# Patient Record
Sex: Male | Born: 1990 | Race: Black or African American | Hispanic: No | Marital: Married | Smoking: Never smoker
Health system: Southern US, Community
[De-identification: ages and names within clinical notes are randomized; demographics above are authoritative.]

---

## 2019-07-16 DIAGNOSIS — Z23 Encounter for immunization: Secondary | ICD-10-CM | POA: Diagnosis not present

## 2019-08-13 DIAGNOSIS — Z23 Encounter for immunization: Secondary | ICD-10-CM | POA: Diagnosis not present

## 2020-07-26 ENCOUNTER — Emergency Department (HOSPITAL_COMMUNITY): Payer: Medicaid Other

## 2020-07-26 ENCOUNTER — Emergency Department (HOSPITAL_COMMUNITY)
Admission: EM | Admit: 2020-07-26 | Discharge: 2020-07-26 | Disposition: A | Discharge status: Home / Self Care | Payer: Medicaid Other | Attending: Emergency Medicine | Admitting: Emergency Medicine

## 2020-07-26 ENCOUNTER — Other Ambulatory Visit: Payer: Self-pay

## 2020-07-26 DIAGNOSIS — S60212A Contusion of left wrist, initial encounter: Secondary | ICD-10-CM

## 2020-07-26 DIAGNOSIS — S0081XA Abrasion of other part of head, initial encounter: Secondary | ICD-10-CM | POA: Diagnosis not present

## 2020-07-26 DIAGNOSIS — M25532 Pain in left wrist: Secondary | ICD-10-CM | POA: Diagnosis not present

## 2020-07-26 DIAGNOSIS — S0990XA Unspecified injury of head, initial encounter: Secondary | ICD-10-CM

## 2020-07-26 DIAGNOSIS — G4489 Other headache syndrome: Secondary | ICD-10-CM | POA: Diagnosis not present

## 2020-07-26 DIAGNOSIS — I10 Essential (primary) hypertension: Secondary | ICD-10-CM | POA: Diagnosis not present

## 2020-07-26 DIAGNOSIS — Y92410 Unspecified street and highway as the place of occurrence of the external cause: Secondary | ICD-10-CM | POA: Insufficient documentation

## 2020-07-26 MED ORDER — METHOCARBAMOL 500 MG PO TABS: 500.0000 mg | ORAL_TABLET | Freq: Two times a day (BID) | ORAL | 0 refills | Status: DC

## 2020-07-26 MED ORDER — IBUPROFEN 400 MG PO TABS
600.0000 mg | ORAL_TABLET | Freq: Once | ORAL | Status: AC
  Administered 2020-07-26: 600 mg via ORAL
  Filled 2020-07-26: qty 1

## 2020-07-26 MED ORDER — METHOCARBAMOL 500 MG PO TABS
500.0000 mg | ORAL_TABLET | Freq: Once | ORAL | Status: AC
  Administered 2020-07-26: 500 mg via ORAL
  Filled 2020-07-26: qty 1

## 2020-07-26 MED ORDER — ACETAMINOPHEN 325 MG PO TABS
650.0000 mg | ORAL_TABLET | Freq: Once | ORAL | Status: AC
  Administered 2020-07-26: 650 mg via ORAL
  Filled 2020-07-26: qty 2
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
## 2020-07-26 NOTE — ED Notes (Signed)
Ortho tech paged for wrist brace placement.

## 2020-07-26 NOTE — ED Notes (Signed)
Patient transported to X-ray 

## 2020-07-26 NOTE — Progress Notes (Signed)
Orthopedic Tech Progress Note Patient Details:  Jesse Vang 1990/09/04 [id number]  Ortho Devices Type of Ortho Device: Velcro wrist splint Ortho Device/Splint Location: lue Ortho Device/Splint Interventions: Ordered,Application,Adjustment   Post Interventions Patient Tolerated: Well Instructions Provided: Care of device,Adjustment of device   Trinna Post 07/26/2020, 9:37 PM
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
## 2020-07-26 NOTE — ED Notes (Signed)
Patient transported to CT 

## 2020-07-27 ENCOUNTER — Telehealth: Payer: Self-pay | Admitting: *Deleted

## 2020-07-27 NOTE — Telephone Encounter (Signed)
Transition Care Management Follow-up Telephone Call  Date of discharge and from where: 07/26/2020 - [facility]  How have you been since you were released from the hospital? "I feel fine"  Any questions or concerns? No  Items Reviewed:  Did the pt receive and understand the discharge instructions provided? Yes   Medications obtained and verified? Yes   Other? No   Any new allergies since your discharge? No   Dietary orders reviewed? No  Do you have support at home? Yes     Functional Questionnaire: (I = Independent and D = Dependent) ADLs: I  Bathing/Dressing- I  Meal Prep- I  Eating- I  Maintaining continence- I  Transferring/Ambulation- I  Managing Meds- I  Follow up appointments reviewed:   PCP Hospital f/u appt confirmed? No    Specialist Hospital f/u appt confirmed? No    Are transportation arrangements needed? No   If their condition worsens, is the pt aware to call PCP or go to the Emergency Dept.? Yes  Was the patient provided with contact information for the PCP's office or ED? Yes  Was to pt encouraged to call back with questions or concerns? Yes

## 2020-08-05 DIAGNOSIS — M79644 Pain in right finger(s): Secondary | ICD-10-CM | POA: Diagnosis not present

## 2020-08-05 DIAGNOSIS — M25532 Pain in left wrist: Secondary | ICD-10-CM | POA: Diagnosis not present

## 2020-08-18 DIAGNOSIS — M654 Radial styloid tenosynovitis [de Quervain]: Secondary | ICD-10-CM | POA: Diagnosis not present

## 2020-08-18 DIAGNOSIS — M25532 Pain in left wrist: Secondary | ICD-10-CM | POA: Diagnosis not present

## 2020-08-19 DIAGNOSIS — H5213 Myopia, bilateral: Secondary | ICD-10-CM | POA: Diagnosis not present

## 2020-09-24 DIAGNOSIS — M654 Radial styloid tenosynovitis [de Quervain]: Secondary | ICD-10-CM | POA: Insufficient documentation

## 2020-09-25 DIAGNOSIS — M654 Radial styloid tenosynovitis [de Quervain]: Secondary | ICD-10-CM | POA: Diagnosis not present

## 2021-11-16 ENCOUNTER — Ambulatory Visit: Payer: Medicaid Other | Admitting: Critical Care Medicine

## 2022-04-12 ENCOUNTER — Encounter: Payer: Self-pay | Admitting: Family Medicine

## 2022-04-12 ENCOUNTER — Ambulatory Visit (INDEPENDENT_AMBULATORY_CARE_PROVIDER_SITE_OTHER): Payer: Medicaid Other | Admitting: Family Medicine

## 2022-04-12 VITALS — BP 128/88 | HR 68 | Ht 73.0 in | Wt 181.1 lb

## 2022-04-12 DIAGNOSIS — R079 Chest pain, unspecified: Secondary | ICD-10-CM

## 2022-04-12 DIAGNOSIS — R7301 Impaired fasting glucose: Secondary | ICD-10-CM | POA: Diagnosis not present

## 2022-04-12 DIAGNOSIS — E039 Hypothyroidism, unspecified: Secondary | ICD-10-CM | POA: Diagnosis not present

## 2022-04-12 DIAGNOSIS — E559 Vitamin D deficiency, unspecified: Secondary | ICD-10-CM

## 2022-04-12 DIAGNOSIS — Z1159 Encounter for screening for other viral diseases: Secondary | ICD-10-CM

## 2022-04-12 DIAGNOSIS — Z114 Encounter for screening for human immunodeficiency virus [HIV]: Secondary | ICD-10-CM | POA: Diagnosis not present

## 2022-04-12 DIAGNOSIS — I2089 Other forms of angina pectoris: Secondary | ICD-10-CM | POA: Diagnosis not present

## 2022-04-12 DIAGNOSIS — E7849 Other hyperlipidemia: Secondary | ICD-10-CM

## 2022-04-12 NOTE — Progress Notes (Signed)
New Patient Office Visit  Subjective:  Patient ID: Jesse Vang, male    DOB: [birth year]  Age: 32 y.o. MRN: [id number]  CC:  Chief Complaint  Patient presents with   New Patient (Initial Visit)    Establishing care needs pcp. States he would like a general check up. Reports mild chest pain at times last time it happened was 03/22/2022.    HPI Jesse Vang is a 32 y.o. male with past medical history of stable angina presents for establishing care. For the details of today's visit, please refer to the assessment and plan.     History reviewed. No pertinent past medical history.  History reviewed. No pertinent surgical history.  History reviewed. No pertinent family history.  Social History   Socioeconomic History   Marital status: Married    Spouse name: Not on file   Number of children: Not on file   Years of education: Not on file   Highest education level: Not on file  Occupational History   Not on file  Tobacco Use   Smoking status: Never    Passive exposure: Never   Smokeless tobacco: Never  Substance and Sexual Activity   Alcohol use: Not Currently   Drug use: Not Currently   Sexual activity: Yes  Other Topics Concern   Not on file  Social History Narrative   Not on file   Social Determinants of Health   Financial Resource Strain: Not on file  Food Insecurity: Not on file  Transportation Needs: Not on file  Physical Activity: Not on file  Stress: Not on file  Social Connections: Not on file  Intimate Partner Violence: Not on file    ROS Review of Systems  Constitutional:  Negative for fatigue and fever.  Eyes:  Negative for visual disturbance.  Respiratory:  Negative for chest tightness and shortness of breath.   Cardiovascular:  Negative for chest pain and palpitations.  Neurological:  Negative for dizziness and headaches.    Objective:   Today's Vitals: BP 128/88 (BP Location: Left Arm)   Pulse 68   Ht _0  (1.854 m)   Wt 181 lb  1.9 oz (82.2 kg)   SpO2 96%   BMI 23.90 kg/m   Physical Exam HENT:     Head: Normocephalic.     Right Ear: External ear normal.     Left Ear: External ear normal.     Nose: No congestion or rhinorrhea.     Mouth/Throat:     Mouth: Mucous membranes are moist.  Cardiovascular:     Rate and Rhythm: Regular rhythm.     Heart sounds: No murmur heard. Pulmonary:     Effort: No respiratory distress.     Breath sounds: Normal breath sounds.  Neurological:     Mental Status: He is alert.      Assessment & Plan:   Stable angina pectoris Assessment & Plan: Onset of symptoms for a year He reports occasional chest pain at least twice a month Pain is rated 6-7 out of 10 Chest pain occurs at rest for less than 5 minutes Pain is described as sharp and nonradiating Pain occurs with activities None reported today No history or family history of cardiovascular disease  Patient is asymptomatic in the clinic EKG in the clinic shows sinus rhythm with first-degree AV block No evidence of ST elevation on EKG Given that patient complains of sporadic exertional chest pain, will refer to cardiology for further evaluation Will assess lipid panel today  Orders: -     Ambulatory referral to Cardiology  Chest pain at rest -     EKG 12-Lead  IFG (impaired fasting glucose) -     Hemoglobin A1c  Vitamin D deficiency -     VITAMIN D 25 Hydroxy (Vit-D Deficiency, Fractures)  Need for hepatitis C screening test -     Hepatitis C antibody  Encounter for screening for HIV -     HIV Antibody (routine testing w rflx)  Hypothyroidism, unspecified type -     TSH + free T4  Other hyperlipidemia -     Lipid panel -     CMP14+EGFR -     CBC with Differential/Platelet     Follow-up: Return in about 4 months (around 08/11/2022).   Alvira Monday, FNP

## 2022-04-12 NOTE — Patient Instructions (Addendum)
I appreciate the opportunity to provide care to you today!    Follow up:  4 months  Labs: please stop by the lab today to get your blood drawn (CBC, CMP, TSH, Lipid profile, HgA1c, Vit D)  Screening: HIV and Hep C   Please continue to a heart-healthy diet and increase your physical activities.   Physical activity helps: Lower your blood glucose, improve your heart health, lower your blood pressure and cholesterol, burn calories to help manage her weight, gave you energy, lower stress, and improve his sleep.  The American diabetes Association (ADA) recommends being active for 2-1/2 hours (150 minutes) or more week.  Exercise for 30 minutes, 5 days a week (150 minutes total)    Referral: cardiology   It was a pleasure to see you and I look forward to continuing to work together on your health and well-being. Please do not hesitate to call the office if you need care or have questions about your care.   Have a wonderful day and week. With Gratitude, Alvira Monday MSN, FNP-BC

## 2022-04-12 NOTE — Assessment & Plan Note (Signed)
Onset of symptoms for a year He reports occasional chest pain at least twice a month Pain is rated 6-7 out of 10 Chest pain occurs at rest for less than 5 minutes Pain is described as sharp and nonradiating Pain occurs with activities None reported today No history or family history of cardiovascular disease  Patient is asymptomatic in the clinic EKG in the clinic shows sinus rhythm with first-degree AV block No evidence of ST elevation on EKG Given that patient complains of sporadic exertional chest pain, will refer to cardiology for further evaluation Will assess lipid panel today

## 2022-04-13 LAB — CMP14+EGFR
ALT: 39 IU/L (ref 0–44)
AST: 31 IU/L (ref 0–40)
Albumin/Globulin Ratio: 1.4 (ref 1.2–2.2)
Albumin: 4.5 g/dL (ref 4.3–5.2)
Alkaline Phosphatase: 67 IU/L (ref 44–121)
BUN/Creatinine Ratio: 13 (ref 9–20)
BUN: 13 mg/dL (ref 6–20)
Bilirubin Total: 0.5 mg/dL (ref 0.0–1.2)
CO2: 24 mmol/L (ref 20–29)
Calcium: 9.8 mg/dL (ref 8.7–10.2)
Chloride: 102 mmol/L (ref 96–106)
Creatinine, Ser: 1.01 mg/dL (ref 0.76–1.27)
Globulin, Total: 3.3 g/dL (ref 1.5–4.5)
Glucose: 92 mg/dL (ref 70–99)
Potassium: 4.3 mmol/L (ref 3.5–5.2)
Sodium: 142 mmol/L (ref 134–144)
Total Protein: 7.8 g/dL (ref 6.0–8.5)
eGFR: 103 mL/min/{1.73_m2} (ref >59)

## 2022-04-13 LAB — CBC WITH DIFFERENTIAL/PLATELET
Basophils Absolute: 0 10*3/uL (ref 0.0–0.2)
Basos: 1 %
EOS (ABSOLUTE): 0.2 10*3/uL (ref 0.0–0.4)
Eos: 5 %
Hematocrit: 44 % (ref 37.5–51.0)
Hemoglobin: 14.5 g/dL (ref 13.0–17.7)
Immature Grans (Abs): 0 10*3/uL (ref 0.0–0.1)
Immature Granulocytes: 0 %
Lymphocytes Absolute: 1.8 10*3/uL (ref 0.7–3.1)
Lymphs: 38 %
MCH: 28.3 pg (ref 26.6–33.0)
MCHC: 33 g/dL (ref 31.5–35.7)
MCV: 86 fL (ref 79–97)
Monocytes Absolute: 0.3 10*3/uL (ref 0.1–0.9)
Monocytes: 7 %
Neutrophils Absolute: 2.3 10*3/uL (ref 1.4–7.0)
Neutrophils: 49 %
Platelets: 309 10*3/uL (ref 150–450)
RBC: 5.12 x10E6/uL (ref 4.14–5.80)
RDW: 11.4 % — ABNORMAL LOW (ref 11.6–15.4)
WBC: 4.7 10*3/uL (ref 3.4–10.8)

## 2022-04-13 LAB — HEPATITIS C ANTIBODY: Hep C Virus Ab: NONREACTIVE

## 2022-04-13 LAB — LIPID PANEL
Chol/HDL Ratio: 3.1 ratio (ref 0.0–5.0)
Cholesterol, Total: 178 mg/dL (ref 100–199)
HDL: 57 mg/dL (ref >39)
LDL Chol Calc (NIH): 101 mg/dL — ABNORMAL HIGH (ref 0–99)
Triglycerides: 111 mg/dL (ref 0–149)
VLDL Cholesterol Cal: 20 mg/dL (ref 5–40)

## 2022-04-13 LAB — HEMOGLOBIN A1C
Est. average glucose Bld gHb Est-mCnc: 100 mg/dL
Hgb A1c MFr Bld: 5.1 % (ref 4.8–5.6)

## 2022-04-13 LAB — VITAMIN D 25 HYDROXY (VIT D DEFICIENCY, FRACTURES): Vit D, 25-Hydroxy: 19 ng/mL — ABNORMAL LOW (ref 30.0–100.0)

## 2022-04-13 LAB — TSH+FREE T4
Free T4: 1.29 ng/dL (ref 0.82–1.77)
TSH: 2.33 u[IU]/mL (ref 0.450–4.500)

## 2022-04-13 LAB — HIV ANTIBODY (ROUTINE TESTING W REFLEX): HIV Screen 4th Generation wRfx: NONREACTIVE

## 2022-04-18 ENCOUNTER — Other Ambulatory Visit: Payer: Self-pay | Admitting: Family Medicine

## 2022-04-18 DIAGNOSIS — E559 Vitamin D deficiency, unspecified: Secondary | ICD-10-CM

## 2022-04-18 MED ORDER — VITAMIN D (ERGOCALCIFEROL) 1.25 MG (50000 UNIT) PO CAPS: 50000.0000 [IU] | ORAL_CAPSULE | ORAL | 1 refills | Status: AC

## 2022-04-18 NOTE — Progress Notes (Signed)
A weekly vitamin D supplement prescription has been sent to your pharmacy because your vitamin D is low.I recommend implementing lifestyle changes by decreasing your intake of saturated and trans fat, with a low-carb diet, and increasing physical activities.

## 2022-05-23 ENCOUNTER — Ambulatory Visit: Payer: Medicaid Other | Attending: Internal Medicine | Admitting: Internal Medicine

## 2022-05-24 ENCOUNTER — Encounter: Payer: Self-pay | Admitting: Internal Medicine

## 2022-06-09 ENCOUNTER — Ambulatory Visit: Payer: Medicaid Other | Attending: Internal Medicine | Admitting: Internal Medicine

## 2022-06-09 ENCOUNTER — Encounter: Payer: Self-pay | Admitting: Internal Medicine

## 2022-06-09 VITALS — BP 120/72 | HR 80 | Ht 73.0 in | Wt 184.4 lb

## 2022-06-09 DIAGNOSIS — R0789 Other chest pain: Secondary | ICD-10-CM | POA: Diagnosis not present

## 2022-06-09 DIAGNOSIS — I440 Atrioventricular block, first degree: Secondary | ICD-10-CM | POA: Diagnosis not present

## 2022-06-09 NOTE — Progress Notes (Signed)
Cardiology Office Note  Date: 06/09/2022   ID: Jesse Vang, DOB 05-Oct-1990, MRN [id number]  PCP:  Alvira Monday, [place]  Cardiologist:  None Electrophysiologist:  None   Reason for Office Visit: Evaluation chest pain at the request of [place], FNP   History of Present Illness: Jesse Vang is a 32 y.o. male with no PMH was referred to cardiology clinic for evaluation of chest pain.  Patient has substernal chest tightness lasting for 10 to 15 minutes x started few years ago, occurs randomly few times per month, no relation with rest or exercise and partially relieved when he stretches his arms/body. Patient is physically very active at baseline, runs and plays basketball few times every week and denies any symptoms of chest tightness during exertional activity. No other symptoms of exertional dizziness/ lightheadedness, syncope, DOE.   Current Outpatient Medications  Medication Sig Dispense Refill   Vitamin D, Ergocalciferol, (DRISDOL) 1.25 MG (50000 UNIT) CAPS capsule Take 1 capsule (50,000 Units total) by mouth every 7 (seven) days. (Patient not taking: Reported on 06/09/2022) 10 capsule 1   No current facility-administered medications for this visit.   Allergies:  Penicillin g   Social History: The patient  reports that he has never smoked. He has never been exposed to tobacco smoke. He has never used smokeless tobacco. He reports that he does not currently use alcohol. He reports that he does not currently use drugs.   Family History: The patient's family history includes Hypertension in his mother.   ROS:  Please see the history of present illness. Otherwise, complete review of systems is positive for none.  All other systems are reviewed and negative.   Physical Exam: VS:  BP 120/72   Pulse 80   Ht '6\' 1"'$  (1.854 m)   Wt 184 lb 6.4 oz (83.6 kg)   SpO2 98%   BMI 24.33 kg/m , BMI Body mass index is 24.33 kg/m.  Wt Readings from Last 3 Encounters:  06/09/22 184 lb  6.4 oz (83.6 kg)  04/12/22 181 lb 1.9 oz (82.2 kg)  07/26/20 163 lb (73.9 kg)    General: Patient appears comfortable at rest. HEENT: Conjunctiva and lids normal, oropharynx clear with moist mucosa. Neck: Supple, no elevated JVP or carotid bruits, no thyromegaly. Lungs: Clear to auscultation, nonlabored breathing at rest. Cardiac: Regular rate and rhythm, no S3 or significant systolic murmur, no pericardial rub. Abdomen: Soft, nontender, no hepatomegaly, bowel sounds present, no guarding or rebound. Extremities: No pitting edema, distal pulses 2+. Skin: Warm and dry. Musculoskeletal: No kyphosis. Neuropsychiatric: Alert and oriented x3, affect grossly appropriate.  ECG:  An ECG dated 04/2022 was personally reviewed today and demonstrated:  Normal sinus rhythm, no ST changes  Recent Labwork: 04/12/2022: ALT 39; AST 31; BUN 13; Creatinine, Ser 1.01; Hemoglobin 14.5; Platelets 309; Potassium 4.3; Sodium 142; TSH 2.330     Component Value Date/Time   CHOL 178 04/12/2022 0915   TRIG 111 04/12/2022 0915   HDL 57 04/12/2022 0915   CHOLHDL 3.1 04/12/2022 0915   LDLCALC 101 (H) 04/12/2022 0915    Other Studies Reviewed Today:   Assessment and Plan: Patient is a 32 year old M with no PMH was referred to cardiology clinic for evaluation of chest pain.  # Noncardiac chest pain -Patient denies any chest tightness during running and playing basketball. He mainly has chest tightness at rest and partially relieved by stretching his body. No further cardiac testing is indicated at this time. Follow-up with PCP for noncardiac causes  of chest tightness.  # First-degree AV block -PR interval is 301 ms on EKG from 04/2022.  Benign, reassurance. No symptoms of dizziness, lightheadedness, syncope.  I have spent a total of 30 minutes with patient reviewing chart, EKGs, labs and examining patient as well as establishing an assessment and plan that was discussed with the patient.  > 50% of time was spent  in direct patient care.     Medication Adjustments/Labs and Tests Ordered: Current medicines are reviewed at length with the patient today.  Concerns regarding medicines are outlined above.   Tests Ordered: No orders of the defined types were placed in this encounter.   Medication Changes: No orders of the defined types were placed in this encounter.   Disposition:  Follow up prn  Signed, Abad Manard Fidel Levy, MD, 06/09/2022 8:46 AM    [facility] [address] [place], [place] [address]

## 2022-06-09 NOTE — Patient Instructions (Signed)
Medication Instructions:  Your physician recommends that you continue on your current medications as directed. Please refer to the Current Medication list given to you today.  *If you need a refill on your cardiac medications before your next appointment, please call your pharmacy*   Lab Work: None If you have labs (blood work) drawn today and your tests are completely normal, you will receive your results only by: [place] (if you have MyChart) OR A paper copy in the mail If you have any lab test that is abnormal or we need to change your treatment, we will call you to review the results.   Testing/Procedures: None   Follow-Up: At [facility], you and your health needs are our priority.  As part of our continuing mission to provide you with exceptional heart care, we have created designated Provider Care Teams.  These Care Teams include your primary Cardiologist (physician) and Advanced Practice Providers (APPs -  Physician Assistants and Nurse Practitioners) who all work together to provide you with the care you need, when you need it.  We recommend signing up for the patient portal called "MyChart".  Sign up information is provided on this After Visit Summary.  MyChart is used to connect with patients for Virtual Visits (Telemedicine).  Patients are able to view lab/test results, encounter notes, upcoming appointments, etc.  Non-urgent messages can be sent to your provider as well.   To learn more about what you can do with MyChart, go to [email or web address].    Your next appointment:   Follow up with Dr. Dellia Cloud as needed.

## 2022-08-11 ENCOUNTER — Ambulatory Visit: Payer: Medicaid Other | Admitting: Family Medicine

## 2022-08-16 ENCOUNTER — Ambulatory Visit: Payer: Medicaid Other | Admitting: Family Medicine

## 2022-08-16 ENCOUNTER — Encounter: Payer: Self-pay | Admitting: Family Medicine

## 2022-08-16 VITALS — BP 136/86 | HR 70 | Ht 73.0 in | Wt 186.0 lb

## 2022-08-16 DIAGNOSIS — E7849 Other hyperlipidemia: Secondary | ICD-10-CM

## 2022-08-16 DIAGNOSIS — E038 Other specified hypothyroidism: Secondary | ICD-10-CM

## 2022-08-16 DIAGNOSIS — M25532 Pain in left wrist: Secondary | ICD-10-CM | POA: Diagnosis not present

## 2022-08-16 DIAGNOSIS — R7301 Impaired fasting glucose: Secondary | ICD-10-CM

## 2022-08-16 DIAGNOSIS — R0981 Nasal congestion: Secondary | ICD-10-CM | POA: Diagnosis not present

## 2022-08-16 DIAGNOSIS — E559 Vitamin D deficiency, unspecified: Secondary | ICD-10-CM | POA: Diagnosis not present

## 2022-08-16 DIAGNOSIS — Z23 Encounter for immunization: Secondary | ICD-10-CM | POA: Diagnosis not present

## 2022-08-16 MED ORDER — MELOXICAM 7.5 MG PO TBDP: 7.5000 mg | ORAL_TABLET | Freq: Every day | ORAL | 0 refills | Status: AC

## 2022-08-16 MED ORDER — FLUTICASONE PROPIONATE 50 MCG/ACT NA SUSP: 2.0000 | Freq: Every day | NASAL | 6 refills | Status: AC

## 2022-08-16 NOTE — Patient Instructions (Addendum)
I appreciate the opportunity to provide care to you today!    Follow up:  5 months  Labs: please stop by the lab during the week to get your blood drawn (CBC, CMP, TSH, Lipid profile, HgA1c, Vit D)  Please pick up your medications at the pharmacy   Please continue to a heart-healthy diet and increase your physical activities. Try to exercise for at least five days a week.      It was a pleasure to see you and I look forward to continuing to work together on your health and well-being. Please do not hesitate to call the office if you need care or have questions about your care.   Have a wonderful day and week. With Gratitude, Gilmore Laroche MSN, FNP-BC

## 2022-08-16 NOTE — Assessment & Plan Note (Addendum)
Negative Frankenstein test No recent injury or trauma reported Range of motion of the left wrist, flexion and extension, radial and ulnar deviation is intact with no pain reported Complaints of left wrist pain when lifting heavy objects The patient was following up with [facility] a year ago and has not been seen since Encouraged use of a wrist splint Will treat today with meloxicam 7.5 mg daily for 2 weeks Encouraged to follow-up with [facility] for worsening of his symptoms

## 2022-08-16 NOTE — Assessment & Plan Note (Signed)
Likely due to allergies We will treat today with Flonase nasal spray No upper respiratory symptoms reported

## 2022-08-16 NOTE — Progress Notes (Signed)
Established Patient Office Visit  Subjective:  Patient ID: Jesse Vang, male    DOB: Jan 14, 1991  Age: 32 y.o. MRN: [id number]  CC:  Chief Complaint  Patient presents with   Chronic Care Management    4 month f/u   Wrist Pain    Pt reports wrist pain and a knot on his left hand.     HPI Jesse Vang is a 32 y.o. male with past medical history of pain of left wrist, Non-cardiac chest pain and  first degree AV block presents for f/u of  chronic medical conditions. For the details of today's visit, please refer to the assessment and plan.     History reviewed. No pertinent past medical history.  History reviewed. No pertinent surgical history.  Family History  Problem Relation Age of Onset   Hypertension Mother     Social History   Socioeconomic History   Marital status: Married    Spouse name: Not on file   Number of children: Not on file   Years of education: Not on file   Highest education level: Not on file  Occupational History   Not on file  Tobacco Use   Smoking status: Never    Passive exposure: Never   Smokeless tobacco: Never  Vaping Use   Vaping Use: Never used  Substance and Sexual Activity   Alcohol use: Not Currently   Drug use: Not Currently   Sexual activity: Yes  Other Topics Concern   Not on file  Social History Narrative   Not on file   Social Determinants of Health   Financial Resource Strain: Not on file  Food Insecurity: Not on file  Transportation Needs: Not on file  Physical Activity: Not on file  Stress: Not on file  Social Connections: Not on file  Intimate Partner Violence: Not on file    Outpatient Medications Prior to Visit  Medication Sig Dispense Refill   Vitamin D, Ergocalciferol, (DRISDOL) 1.25 MG (50000 UNIT) CAPS capsule Take 1 capsule (50,000 Units total) by mouth every 7 (seven) days. (Patient not taking: Reported on 08/16/2022) 10 capsule 1   No facility-administered medications prior to visit.     Allergies  Allergen Reactions   Penicillin G Rash    ROS Review of Systems  Constitutional:  Negative for fatigue and fever.  HENT:         Nasal congestion  Eyes:  Negative for visual disturbance.  Respiratory:  Negative for chest tightness and shortness of breath.   Cardiovascular:  Negative for chest pain and palpitations.  Musculoskeletal:        Left wrist pain  Neurological:  Negative for dizziness and headaches.      Objective:    Physical Exam HENT:     Head: Normocephalic.     Right Ear: External ear normal.     Left Ear: External ear normal.     Nose: No congestion or rhinorrhea.     Left Turbinates: Swollen.     Mouth/Throat:     Mouth: Mucous membranes are moist.  Cardiovascular:     Rate and Rhythm: Regular rhythm.     Heart sounds: No murmur heard. Pulmonary:     Effort: No respiratory distress.     Breath sounds: Normal breath sounds.  Musculoskeletal:     Left wrist: No swelling, deformity or tenderness. Normal range of motion.     Comments: Dorsal nontender ganglion cyst of the left wrist  Neurological:     Mental  Status: He is alert.     BP 136/86 (BP Location: Left Arm)   Pulse 70   Ht 6\' 1"  (1.854 m)   Wt 186 lb 0.6 oz (84.4 kg)   SpO2 95%   BMI 24.55 kg/m  Wt Readings from Last 3 Encounters:  08/16/22 186 lb 0.6 oz (84.4 kg)  06/09/22 184 lb 6.4 oz (83.6 kg)  04/12/22 181 lb 1.9 oz (82.2 kg)    Lab Results  Component Value Date   TSH 2.330 04/12/2022   Lab Results  Component Value Date   WBC 4.7 04/12/2022   HGB 14.5 04/12/2022   HCT 44.0 04/12/2022   MCV 86 04/12/2022   PLT 309 04/12/2022   Lab Results  Component Value Date   NA 142 04/12/2022   K 4.3 04/12/2022   CO2 24 04/12/2022   GLUCOSE 92 04/12/2022   BUN 13 04/12/2022   CREATININE 1.01 04/12/2022   BILITOT 0.5 04/12/2022   ALKPHOS 67 04/12/2022   AST 31 04/12/2022   ALT 39 04/12/2022   PROT 7.8 04/12/2022   ALBUMIN 4.5 04/12/2022   CALCIUM 9.8  04/12/2022   EGFR 103 04/12/2022   Lab Results  Component Value Date   CHOL 178 04/12/2022   Lab Results  Component Value Date   HDL 57 04/12/2022   Lab Results  Component Value Date   LDLCALC 101 (H) 04/12/2022   Lab Results  Component Value Date   TRIG 111 04/12/2022   Lab Results  Component Value Date   CHOLHDL 3.1 04/12/2022   Lab Results  Component Value Date   HGBA1C 5.1 04/12/2022      Assessment & Plan:  Left wrist pain Assessment & Plan: Negative Frankenstein test No recent injury or trauma reported Range of motion of the left wrist, flexion and extension, radial and ulnar deviation is intact with no pain reported Complaints of left wrist pain when lifting heavy objects The patient was following up with [facility] a year ago and has not been seen since Encouraged use of a wrist splint Will treat today with meloxicam 7.5 mg daily for 2 weeks Encouraged to follow-up with [facility] for worsening of his symptoms   Orders: -     Meloxicam; Take 7.5 mg by mouth daily for 14 days.  Dispense: 30 tablet; Refill: 0  Nasal congestion Assessment & Plan: Likely due to allergies We will treat today with Flonase nasal spray No upper respiratory symptoms reported  Orders: -     Fluticasone Propionate; Place 2 sprays into both nostrils daily.  Dispense: 16 g; Refill: 6  Immunization due -     Tdap vaccine greater than or equal to 7yo IM  IFG (impaired fasting glucose) -     Hemoglobin A1c  Vitamin D deficiency -     VITAMIN D 25 Hydroxy (Vit-D Deficiency, Fractures)  Other specified hypothyroidism -     TSH + free T4  Other hyperlipidemia -     Lipid panel -     CMP14+EGFR -     CBC with Differential/Platelet    Follow-up: Return in about 5 months (around 01/16/2023).   Jesse Laroche, FNP
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
## 2022-12-26 DIAGNOSIS — H5213 Myopia, bilateral: Secondary | ICD-10-CM | POA: Diagnosis not present

## 2023-02-16 ENCOUNTER — Ambulatory Visit: Payer: Medicaid Other | Admitting: Family Medicine

## 2023-02-16 ENCOUNTER — Encounter: Payer: Self-pay | Admitting: Family Medicine

## 2023-02-16 ENCOUNTER — Ambulatory Visit (INDEPENDENT_AMBULATORY_CARE_PROVIDER_SITE_OTHER): Payer: Medicaid Other | Admitting: Family Medicine

## 2023-02-16 VITALS — BP 120/82 | HR 85 | Ht 73.0 in | Wt 183.1 lb

## 2023-02-16 DIAGNOSIS — R7301 Impaired fasting glucose: Secondary | ICD-10-CM | POA: Diagnosis not present

## 2023-02-16 DIAGNOSIS — E559 Vitamin D deficiency, unspecified: Secondary | ICD-10-CM

## 2023-02-16 DIAGNOSIS — E038 Other specified hypothyroidism: Secondary | ICD-10-CM

## 2023-02-16 DIAGNOSIS — E7849 Other hyperlipidemia: Secondary | ICD-10-CM

## 2023-02-16 NOTE — Progress Notes (Signed)
Established Patient Office Visit  Subjective:  Patient ID: Jesse Vang, male    DOB: [birth year]  Age: 32 y.o. MRN: [id number]  CC:  Chief Complaint  Patient presents with   Care Management    Would like a general check up, last seen 08/16/22. Pt reports a seasonal cough with slight phlegm.     HPI Jesse Vang is a 32 y.o. male with past medical history of vit d def presents for f/u of  chronic medical conditions. For the details of today's visit, please refer to the assessment and plan.     History reviewed. No pertinent past medical history.  History reviewed. No pertinent surgical history.  Family History  Problem Relation Age of Onset   Hypertension Mother     Social History   Socioeconomic History   Marital status: Married    Spouse name: Not on file   Number of children: Not on file   Years of education: Not on file   Highest education level: Not on file  Occupational History   Not on file  Tobacco Use   Smoking status: Never    Passive exposure: Never   Smokeless tobacco: Never  Vaping Use   Vaping status: Never Used  Substance and Sexual Activity   Alcohol use: Not Currently   Drug use: Not Currently   Sexual activity: Yes  Other Topics Concern   Not on file  Social History Narrative   Not on file   Social Determinants of Health   Financial Resource Strain: Not on file  Food Insecurity: Not on file  Transportation Needs: Not on file  Physical Activity: Not on file  Stress: Not on file  Social Connections: Not on file  Intimate Partner Violence: Not on file    Outpatient Medications Prior to Visit  Medication Sig Dispense Refill   fluticasone (FLONASE) 50 MCG/ACT nasal spray Place 2 sprays into both nostrils daily. (Patient not taking: Reported on 02/16/2023) 16 g 6   Vitamin D, Ergocalciferol, (DRISDOL) 1.25 MG (50000 UNIT) CAPS capsule Take 1 capsule (50,000 Units total) by mouth every 7 (seven) days. (Patient not taking: Reported on  02/16/2023) 10 capsule 1   No facility-administered medications prior to visit.    Allergies  Allergen Reactions   Penicillin G Rash    ROS Review of Systems  Constitutional:  Negative for fatigue and fever.  Eyes:  Negative for visual disturbance.  Respiratory:  Negative for chest tightness and shortness of breath.   Cardiovascular:  Negative for chest pain and palpitations.  Neurological:  Negative for dizziness and headaches.      Objective:    Physical Exam HENT:     Head: Normocephalic.     Right Ear: External ear normal.     Left Ear: External ear normal.     Nose: No congestion or rhinorrhea.     Mouth/Throat:     Mouth: Mucous membranes are moist.  Cardiovascular:     Rate and Rhythm: Regular rhythm.     Heart sounds: No murmur heard. Pulmonary:     Effort: No respiratory distress.     Breath sounds: Normal breath sounds.  Neurological:     Mental Status: He is alert.     Cranial Nerves: No cranial nerve deficit.     BP 120/82   Pulse 85   Ht 6\' 1"  (1.854 m)   Wt 183 lb 1.9 oz (83.1 kg)   SpO2 93%   BMI 24.16 kg/m  Wt Readings  from Last 3 Encounters:  02/16/23 183 lb 1.9 oz (83.1 kg)  08/16/22 186 lb 0.6 oz (84.4 kg)  06/09/22 184 lb 6.4 oz (83.6 kg)    Lab Results  Component Value Date   TSH 2.330 04/12/2022   Lab Results  Component Value Date   WBC 4.7 04/12/2022   HGB 14.5 04/12/2022   HCT 44.0 04/12/2022   MCV 86 04/12/2022   PLT 309 04/12/2022   Lab Results  Component Value Date   NA 142 04/12/2022   K 4.3 04/12/2022   CO2 24 04/12/2022   GLUCOSE 92 04/12/2022   BUN 13 04/12/2022   CREATININE 1.01 04/12/2022   BILITOT 0.5 04/12/2022   ALKPHOS 67 04/12/2022   AST 31 04/12/2022   ALT 39 04/12/2022   PROT 7.8 04/12/2022   ALBUMIN 4.5 04/12/2022   CALCIUM 9.8 04/12/2022   EGFR 103 04/12/2022   Lab Results  Component Value Date   CHOL 178 04/12/2022   Lab Results  Component Value Date   HDL 57 04/12/2022   Lab Results   Component Value Date   LDLCALC 101 (H) 04/12/2022   Lab Results  Component Value Date   TRIG 111 04/12/2022   Lab Results  Component Value Date   CHOLHDL 3.1 04/12/2022   Lab Results  Component Value Date   HGBA1C 5.1 04/12/2022      Assessment & Plan:  Vitamin D deficiency Assessment & Plan: Pending labs  Orders: -     VITAMIN D 25 Hydroxy (Vit-D Deficiency, Fractures)  IFG (impaired fasting glucose) -     Hemoglobin A1c  TSH (thyroid-stimulating hormone deficiency) -     TSH + free T4  Other hyperlipidemia -     Lipid panel -     CMP14+EGFR -     CBC with Differential/Platelet  Note: This chart has been completed using Engineer, civil (consulting) software, and while attempts have been made to ensure accuracy, certain words and phrases may not be transcribed as intended.    Follow-up: No follow-ups on file.   Gilmore Laroche, FNP

## 2023-02-16 NOTE — Assessment & Plan Note (Signed)
Pending labs

## 2023-02-16 NOTE — Patient Instructions (Addendum)
I appreciate the opportunity to provide care to you today!    Follow up:  3 months  Labs: please stop by the lab today to get your blood drawn (CBC, CMP, TSH, Lipid profile, HgA1c, Vit D)   Attached with your AVS, you will find valuable resources for self-education. I highly recommend dedicating some time to thoroughly examine them.   Please continue to a heart-healthy diet and increase your physical activities. Try to exercise for at least five days a week.    It was a pleasure to see you and I look forward to continuing to work together on your health and well-being. Please do not hesitate to call the office if you need care or have questions about your care.  In case of emergency, please visit the Emergency Department for urgent care, or contact our clinic at [phone] to schedule an appointment. We're here to help you!   Have a wonderful day and week. With Gratitude, Gilmore Laroche MSN, FNP-BC

## 2023-02-17 LAB — TSH+FREE T4
Free T4: 1.33 ng/dL (ref 0.82–1.77)
TSH: 1.63 u[IU]/mL (ref 0.450–4.500)

## 2023-02-17 LAB — CMP14+EGFR
ALT: 32 [IU]/L (ref 0–44)
AST: 31 [IU]/L (ref 0–40)
Albumin: 4.5 g/dL (ref 4.1–5.1)
Alkaline Phosphatase: 73 [IU]/L (ref 44–121)
BUN/Creatinine Ratio: 10 (ref 9–20)
BUN: 11 mg/dL (ref 6–20)
Bilirubin Total: 0.7 mg/dL (ref 0.0–1.2)
CO2: 23 mmol/L (ref 20–29)
Calcium: 9.6 mg/dL (ref 8.7–10.2)
Chloride: 101 mmol/L (ref 96–106)
Creatinine, Ser: 1.12 mg/dL (ref 0.76–1.27)
Globulin, Total: 3.1 g/dL (ref 1.5–4.5)
Glucose: 90 mg/dL (ref 70–99)
Potassium: 4.1 mmol/L (ref 3.5–5.2)
Sodium: 140 mmol/L (ref 134–144)
Total Protein: 7.6 g/dL (ref 6.0–8.5)
eGFR: 90 mL/min/{1.73_m2} (ref >59)

## 2023-02-17 LAB — HEMOGLOBIN A1C
Est. average glucose Bld gHb Est-mCnc: 97 mg/dL
Hgb A1c MFr Bld: 5 % (ref 4.8–5.6)

## 2023-02-17 LAB — CBC WITH DIFFERENTIAL/PLATELET
Basophils Absolute: 0 10*3/uL (ref 0.0–0.2)
Basos: 1 %
EOS (ABSOLUTE): 0.2 10*3/uL (ref 0.0–0.4)
Eos: 4 %
Hematocrit: 43.7 % (ref 37.5–51.0)
Hemoglobin: 14.1 g/dL (ref 13.0–17.7)
Immature Grans (Abs): 0 10*3/uL (ref 0.0–0.1)
Immature Granulocytes: 0 %
Lymphocytes Absolute: 1.6 10*3/uL (ref 0.7–3.1)
Lymphs: 39 %
MCH: 28.7 pg (ref 26.6–33.0)
MCHC: 32.3 g/dL (ref 31.5–35.7)
MCV: 89 fL (ref 79–97)
Monocytes Absolute: 0.3 10*3/uL (ref 0.1–0.9)
Monocytes: 8 %
Neutrophils Absolute: 2.1 10*3/uL (ref 1.4–7.0)
Neutrophils: 48 %
Platelets: 257 10*3/uL (ref 150–450)
RBC: 4.91 x10E6/uL (ref 4.14–5.80)
RDW: 11.7 % (ref 11.6–15.4)
WBC: 4.3 10*3/uL (ref 3.4–10.8)

## 2023-02-17 LAB — LIPID PANEL
Chol/HDL Ratio: 3.2 ratio (ref 0.0–5.0)
Cholesterol, Total: 200 mg/dL — ABNORMAL HIGH (ref 100–199)
HDL: 63 mg/dL (ref >39)
LDL Chol Calc (NIH): 126 mg/dL — ABNORMAL HIGH (ref 0–99)
Triglycerides: 61 mg/dL (ref 0–149)
VLDL Cholesterol Cal: 11 mg/dL (ref 5–40)

## 2023-02-17 LAB — VITAMIN D 25 HYDROXY (VIT D DEFICIENCY, FRACTURES): Vit D, 25-Hydroxy: 36.6 ng/mL (ref 30.0–100.0)

## 2023-05-26 ENCOUNTER — Ambulatory Visit: Payer: Medicaid Other | Admitting: Family Medicine

## 2023-11-19 DIAGNOSIS — J029 Acute pharyngitis, unspecified: Secondary | ICD-10-CM | POA: Diagnosis not present

## 2023-11-19 DIAGNOSIS — R070 Pain in throat: Secondary | ICD-10-CM | POA: Diagnosis not present

## 2024-02-22 ENCOUNTER — Ambulatory Visit: Admitting: Family Medicine
